# Patient Record
Sex: Male | Born: 1995 | Race: Black or African American | Hispanic: No | Marital: Single | Smoking: Current every day smoker
Health system: Southern US, Community
[De-identification: ages and names within clinical notes are randomized; demographics above are authoritative.]

---
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
## 2018-01-28 ENCOUNTER — Encounter (HOSPITAL_COMMUNITY): Payer: Self-pay | Admitting: Family Medicine

## 2018-01-28 ENCOUNTER — Ambulatory Visit (HOSPITAL_COMMUNITY)
Admission: EM | Admit: 2018-01-28 | Discharge: 2018-01-28 | Disposition: A | Discharge status: Home / Self Care | Payer: BLUE CROSS/BLUE SHIELD | Attending: Family Medicine | Admitting: Family Medicine

## 2018-01-28 DIAGNOSIS — W57XXXA Bitten or stung by nonvenomous insect and other nonvenomous arthropods, initial encounter: Secondary | ICD-10-CM | POA: Diagnosis not present

## 2018-01-28 DIAGNOSIS — S80861A Insect bite (nonvenomous), right lower leg, initial encounter: Secondary | ICD-10-CM | POA: Diagnosis not present

## 2018-01-28 MED ORDER — DOXYCYCLINE HYCLATE 100 MG PO CAPS: ORAL_CAPSULE | ORAL | 0 refills | Status: AC

## 2018-01-28 NOTE — Discharge Instructions (Signed)
Take the antibiotic Use a cortisone cream on the rash/bite area Return as needed

## 2018-01-28 NOTE — ED Provider Notes (Signed)
[facility]    CSN: [id number] Arrival date & time: 01/28/18  1520     History   Chief Complaint Chief Complaint  Patient presents with  . Insect Bite    HPI Craig Mclaughlin is a 22 y.o. male.   HPI  Patient had a tick on his right ankle last night.  He fallen off.  This morning the area swollen and red and a little bit itchy.  He is never had this reaction to take before.  He is uncertain how long the tick is been on his skin, but it looked like it was firmly attached.  He is in good health and on no medications.  History reviewed. No pertinent past medical history.  Patient Active Problem List   Diagnosis Date Noted  . VIRAL CONJUNCTIVITIS 08/27/2008  . UNSPECIFIED MENTAL OR BEHAVIORAL PROBLEM 09/05/2007  . EPISTAXIS 08/05/2006    History reviewed. No pertinent surgical history.     Home Medications    Prior to Admission medications   Medication Sig Start Date End Date Taking? Authorizing Provider  doxycycline (VIBRAMYCIN) 100 MG capsule Take 2 pills as single dose 01/28/18   Eustace MooreNelson, Nichlos Kunzler Sue, MD    Family History History reviewed. No pertinent family history.  Social History Social History   Tobacco Use  . Smoking status: Never Smoker  . Smokeless tobacco: Never Used  Substance Use Topics  . Alcohol use: Not on file  . Drug use: Not on file     Allergies   Patient has no known allergies.   Review of Systems Review of Systems  Constitutional: Negative for chills and fever.  HENT: Negative for ear pain and sore throat.   Eyes: Negative for pain and visual disturbance.  Respiratory: Negative for cough and shortness of breath.   Cardiovascular: Negative for chest pain and palpitations.  Gastrointestinal: Negative for abdominal pain and vomiting.  Genitourinary: Negative for dysuria and hematuria.  Musculoskeletal: Negative for arthralgias and back pain.  Skin: Positive for wound. Negative for color change and rash.  Neurological:  Negative for seizures and syncope.  All other systems reviewed and are negative.    Physical Exam Triage Vital Signs ED Triage Vitals [01/28/18 1540]  Enc Vitals Group     BP 132/66     Pulse Rate 77     Resp 18     Temp 98.4 F (36.9 C)     Temp src      SpO2 98 %     Weight      Height      Head Circumference      Peak Flow      Pain Score 5     Pain Loc      Pain Edu?      Excl. in GC?    No data found.  Updated Vital Signs BP 132/66   Pulse 77   Temp 98.4 F (36.9 C)   Resp 18   SpO2 98%   Visual Acuity Right Eye Distance:   Left Eye Distance:   Bilateral Distance:    Right Eye Near:   Left Eye Near:    Bilateral Near:     Physical Exam  Constitutional: He appears well-developed and well-nourished. No distress.  HENT:  Head: Normocephalic and atraumatic.  Mouth/Throat: Oropharynx is clear and moist.  Eyes: Pupils are equal, round, and reactive to light. Conjunctivae are normal.  Neck: Normal range of motion.  Cardiovascular: Normal rate.  Pulmonary/Chest: Effort normal.  No respiratory distress.  Abdominal: Soft. He exhibits no distension.  Musculoskeletal: Normal range of motion. He exhibits no edema.  Neurological: He is alert.  Skin: Skin is warm and dry. Rash noted.  On the medial portion of the right ankle there is a punctate wound from the tick removal with 4 cm of surrounding soft tissue swelling and erythema     UC Treatments / Results  Labs (all labs ordered are listed, but only abnormal results are displayed) Labs Reviewed - No data to display  EKG None  Radiology No results found.  Procedures Procedures (including critical care time)  Medications Ordered in UC Medications - No data to display  Initial Impression / Assessment and Plan / UC Course  I have reviewed the triage vital signs and the nursing notes.  Pertinent labs & imaging results that were available during my care of the patient were reviewed by me and  considered in my medical decision making (see chart for details).      Final Clinical Impressions(s) / UC Diagnoses   Final diagnoses:  Tick bite, initial encounter     Discharge Instructions     Take the antibiotic Use a cortisone cream on the rash/bite area Return as needed   ED Prescriptions    Medication Sig Dispense Auth. Provider   doxycycline (VIBRAMYCIN) 100 MG capsule Take 2 pills as single dose 2 capsule Eustace Moore, MD     Controlled Substance Prescriptions [place] Controlled Substance Registry consulted? Not Applicable   Eustace Moore, MD 01/28/18 [phone]

## 2018-01-28 NOTE — ED Notes (Addendum)
Pt here for tick bite to RLE. He pulled it off yesterday but this morning noticed the head was still attached. He removed that and now there is redness and swelling to the area. Tender to the touch. He sts some body aches that started this am. He is unsure of when the tick attached but he was working under some trees at work yesterday.

## 2018-06-26 ENCOUNTER — Other Ambulatory Visit: Payer: Self-pay

## 2018-06-26 ENCOUNTER — Emergency Department (HOSPITAL_COMMUNITY)
Admission: EM | Admit: 2018-06-26 | Discharge: 2018-06-26 | Disposition: A | Discharge status: Home / Self Care | Payer: BLUE CROSS/BLUE SHIELD | Attending: Emergency Medicine | Admitting: Emergency Medicine

## 2018-06-26 ENCOUNTER — Encounter (HOSPITAL_COMMUNITY): Payer: Self-pay | Admitting: Emergency Medicine

## 2018-06-26 DIAGNOSIS — H5789 Other specified disorders of eye and adnexa: Secondary | ICD-10-CM | POA: Diagnosis not present

## 2018-06-26 DIAGNOSIS — F17210 Nicotine dependence, cigarettes, uncomplicated: Secondary | ICD-10-CM | POA: Insufficient documentation

## 2018-06-26 DIAGNOSIS — H5711 Ocular pain, right eye: Secondary | ICD-10-CM | POA: Diagnosis present

## 2018-06-26 MED ORDER — TETRACAINE HCL 0.5 % OP SOLN
2.0000 [drp] | Freq: Once | OPHTHALMIC | Status: AC
  Administered 2018-06-26: 2 [drp] via OPHTHALMIC
  Filled 2018-06-26: qty 4

## 2018-06-26 MED ORDER — FLUORESCEIN SODIUM 1 MG OP STRP
1.0000 | ORAL_STRIP | Freq: Once | OPHTHALMIC | Status: AC
  Administered 2018-06-26: 1 via OPHTHALMIC
  Filled 2018-06-26: qty 1

## 2018-06-26 NOTE — ED Notes (Signed)
ED Provider at bedside. 

## 2018-06-26 NOTE — ED Triage Notes (Signed)
C/o pain and redness to R eye since 3pm.  States he was tearing down a roof and thinks he got something in it.

## 2018-06-26 NOTE — Discharge Instructions (Signed)
Use over the counter eyedrops for lubrication as needed. Use a cool compress to help with irritation of the eye. Otherwise, try not to touch or irritate your eye.   If it is not improving the next several days, follow-up with eye doctor whose information is below. Return to the emergency room with vision loss, inability to move your eye, severe pain, or any new or concerning symptoms.

## 2018-06-27 NOTE — ED Provider Notes (Signed)
[facility] EMERGENCY DEPARTMENT Provider Note   CSN: [id number] Arrival date & time: 06/26/18  1940     History   Chief Complaint Chief Complaint  Patient presents with  . Eye Pain    HPI Craig Mclaughlin is a 22 y.o. male presenting for evaluation of right eye irritation.   Patient states he was taking shingles off a roof when he felt like some dust got in his eye.  He washed his eye with water, but has remaining eye irritation.  It is worse with movement of his eye.  He denies vision changes.  He denies eye pain.  He denies drainage from the eye.  He does not wear contacts or glasses.  He denies symptoms in his left eye.  He has never had issues with his eyes before, never seen an optometrist/ophthalmologist.  He has no medical problems, takes no medications daily.  HPI  History reviewed. No pertinent past medical history.  Patient Active Problem List   Diagnosis Date Noted  . VIRAL CONJUNCTIVITIS 08/27/2008  . UNSPECIFIED MENTAL OR BEHAVIORAL PROBLEM 09/05/2007  . EPISTAXIS 08/05/2006    History reviewed. No pertinent surgical history.      Home Medications    Prior to Admission medications   Medication Sig Start Date End Date Taking? Authorizing Provider  doxycycline (VIBRAMYCIN) 100 MG capsule Take 2 pills as single dose 01/28/18   Eustace Moore, MD    Family History No family history on file.  Social History Social History   Tobacco Use  . Smoking status: Current Every Day Smoker  . Smokeless tobacco: Never Used  Substance Use Topics  . Alcohol use: Not Currently  . Drug use: Not Currently     Allergies   Patient has no known allergies.   Review of Systems Review of Systems  Constitutional: Negative for fever.  Eyes: Negative for photophobia and visual disturbance.       Right eye irritation and redness.     Physical Exam Updated Vital Signs BP 115/64 (BP Location: Right Arm)   Pulse (!) 58   Temp 98.4 F (36.9  C) (Oral)   Resp 14   Ht 6\' 1"  (1.854 m)   Wt 108.9 kg   SpO2 100%   BMI 31.66 kg/m   Physical Exam  Constitutional: He is oriented to person, place, and time. He appears well-developed and well-nourished. No distress.  Sitting comfortably in the bed in no acute distress  HENT:  Head: Normocephalic and atraumatic.  OP clear without tonsillar swelling or exudate.  Uvula midline with equal palate rise.  TMs nonerythematous and nonbulging bilaterally.  Eyes: Pupils are equal, round, and reactive to light. EOM and lids are normal. Right conjunctiva is injected. Left conjunctiva is not injected. Left conjunctiva has no hemorrhage.  Slit lamp exam:      The right eye shows no corneal abrasion, no foreign body and no fluorescein uptake.  Conjunctival injection of the right eye on the medial aspect.  No injection of the lateral eye.  EOMI and PERRLA.  No fluorescein stain uptake or obvious foreign bodies.  No abrasions.  pH of right eye 7.  Irritation resolved with tetracaine drops  Neck: Normal range of motion.  Cardiovascular: Normal rate, regular rhythm and intact distal pulses.  Pulmonary/Chest: Effort normal and breath sounds normal. No respiratory distress. He has no wheezes.  Abdominal: He exhibits no distension.  Musculoskeletal: Normal range of motion.  Neurological: He is alert and oriented  to person, place, and time.  Skin: Skin is warm. No rash noted.  Psychiatric: He has a normal mood and affect.  Nursing note and vitals reviewed.    ED Treatments / Results  Labs (all labs ordered are listed, but only abnormal results are displayed) Labs Reviewed - No data to display  EKG None  Radiology No results found.  Procedures Procedures (including critical care time)  Medications Ordered in ED Medications  tetracaine (PONTOCAINE) 0.5 % ophthalmic solution 2 drop (2 drops Right Eye Given 06/26/18 2245)  fluorescein ophthalmic strip 1 strip (1 strip Right Eye Given 06/26/18  2245)     Initial Impression / Assessment and Plan / ED Course  I have reviewed the triage vital signs and the nursing notes.  Pertinent labs & imaging results that were available during my care of the patient were reviewed by me and considered in my medical decision making (see chart for details).     Pt presenting for evaluation of right eye irritation.  Physical exam reassuring, no visual acuity changes, vision loss, or significant pain.  No fluorescein stain uptake or abnormalities of the pH.  Symptoms improved with tetracaine drops.  As there is no foreign body or scrape, likely irritation conjunctivitis.  Doubt acute angle-closure, no pain.  Doubt iritis/scleritis, no pain or photophobia.  Discussed findings with patient.  Discussed treatment with lubricating eyedrops, cool compresses, and time.  Discussed follow-up with ophthalmology if symptoms not improving.  Strict return precautions given.  At this time, patient appears safe for discharge.  Return precautions given.  Patient states he understands and agrees to plan.   Final Clinical Impressions(s) / ED Diagnoses   Final diagnoses:  Eye irritation    ED Discharge Orders    None       Alveria Apley, PA-C 06/27/18 0045    Raeford Razor, MD 06/30/18 1010

## 2024-02-01 ENCOUNTER — Encounter (HOSPITAL_COMMUNITY): Payer: Self-pay | Admitting: *Deleted

## 2024-02-01 ENCOUNTER — Emergency Department (HOSPITAL_COMMUNITY): Payer: Self-pay

## 2024-02-01 ENCOUNTER — Emergency Department (HOSPITAL_COMMUNITY)
Admission: EM | Admit: 2024-02-01 | Discharge: 2024-02-01 | Disposition: A | Discharge status: Home / Self Care | Payer: Self-pay | Attending: Emergency Medicine | Admitting: Emergency Medicine

## 2024-02-01 ENCOUNTER — Other Ambulatory Visit: Payer: Self-pay

## 2024-02-01 DIAGNOSIS — W312XXA Contact with powered woodworking and forming machines, initial encounter: Secondary | ICD-10-CM | POA: Insufficient documentation

## 2024-02-01 DIAGNOSIS — Z23 Encounter for immunization: Secondary | ICD-10-CM | POA: Insufficient documentation

## 2024-02-01 DIAGNOSIS — S51811A Laceration without foreign body of right forearm, initial encounter: Secondary | ICD-10-CM | POA: Insufficient documentation

## 2024-02-01 MED ORDER — LIDOCAINE-EPINEPHRINE (PF) 2 %-1:200000 IJ SOLN
10.0000 mL | Freq: Once | INTRAMUSCULAR | Status: AC
  Administered 2024-02-01: 10 mL via INTRADERMAL
  Filled 2024-02-01: qty 20

## 2024-02-01 MED ORDER — TETANUS-DIPHTH-ACELL PERTUSSIS 5-2.5-18.5 LF-MCG/0.5 IM SUSY
0.5000 mL | PREFILLED_SYRINGE | Freq: Once | INTRAMUSCULAR | Status: AC
  Administered 2024-02-01: 0.5 mL via INTRAMUSCULAR
  Filled 2024-02-01: qty 0.5

## 2024-02-01 MED ORDER — LIDOCAINE-EPINEPHRINE-TETRACAINE (LET) TOPICAL GEL
3.0000 mL | Freq: Once | TOPICAL | Status: AC
  Administered 2024-02-01: 3 mL via TOPICAL
  Filled 2024-02-01: qty 3

## 2024-02-01 NOTE — ED Triage Notes (Signed)
 Pt states he was using a band saw and accidentally  hit his right arm; pt has small laceration to right forearm with no bleeding at this time  Pt states he does not know when he received his last tetanus shot

## 2024-02-01 NOTE — Discharge Instructions (Signed)
 5 stitches will need to be removed in approximately 1 week.  Keep area of wound clean and dry.  If area becomes increasingly red, painful, swollen, or begins draining pus, return to the emergency department.

## 2024-02-01 NOTE — ED Provider Notes (Signed)
 [place] EMERGENCY DEPARTMENT AT [facility] Provider Note   CSN: [id number] Arrival date & time: 02/01/24  0351     History  Chief Complaint  Patient presents with   Laceration    Craig Mclaughlin is a 28 y.o. male.  HPI Patient presents for laceration.  He has no known chronic medical conditions.  He is unaware when his last tetanus shot was.  This morning, patient was cutting with a saw.  The saw blade shattered and a piece of it cut him on the posterior aspect of his right forearm.  He denies any other areas of injury concern.    Home Medications Prior to Admission medications   Medication Sig Start Date End Date Taking? Authorizing Provider  doxycycline  (VIBRAMYCIN ) 100 MG capsule Take 2 pills as single dose 01/28/18   Stephany Ehrich, MD      Allergies    Patient has no known allergies.    Review of Systems   Review of Systems  Skin:  Positive for wound.  All other systems reviewed and are negative.   Physical Exam Updated Vital Signs BP (!) 147/76   Pulse 68   Temp 98.2 F (36.8 C) (Oral)   Resp 17   Ht 6\' 1"  (1.854 m)   Wt 88.5 kg   SpO2 96%   BMI 25.73 kg/m  Physical Exam Vitals and nursing note reviewed.  Constitutional:      General: He is not in acute distress.    Appearance: Normal appearance. He is well-developed. He is not ill-appearing, toxic-appearing or diaphoretic.  HENT:     Head: Normocephalic and atraumatic.     Right Ear: External ear normal.     Left Ear: External ear normal.     Nose: Nose normal.     Mouth/Throat:     Mouth: Mucous membranes are moist.  Eyes:     Extraocular Movements: Extraocular movements intact.     Conjunctiva/sclera: Conjunctivae normal.  Cardiovascular:     Rate and Rhythm: Normal rate and regular rhythm.  Pulmonary:     Effort: Pulmonary effort is normal. No respiratory distress.  Abdominal:     General: There is no distension.     Palpations: Abdomen is soft.  Musculoskeletal:         General: Signs of injury present. No swelling or deformity.     Cervical back: Normal range of motion and neck supple.  Skin:    General: Skin is warm and dry.     Coloration: Skin is not jaundiced or pale.  Neurological:     General: No focal deficit present.     Mental Status: He is alert and oriented to person, place, and time.     Cranial Nerves: No cranial nerve deficit.     Sensory: No sensory deficit.     Motor: No weakness.     Coordination: Coordination normal.  Psychiatric:        Mood and Affect: Mood normal.        Behavior: Behavior normal.     ED Results / Procedures / Treatments   Labs (all labs ordered are listed, but only abnormal results are displayed) Labs Reviewed - No data to display  EKG None  Radiology DG Forearm Right Result Date: 02/01/2024 CLINICAL DATA:  28 year old male with band saw accident, trauma to right upper extremity. Query foreign body. EXAM: RIGHT FOREARM - 2 VIEW COMPARISON:  None Available. FINDINGS: Three views of the right forearm [id number]  hours. Soft tissue irregularity and deficiency overlying the distal 3rd right ulna. No tracking soft tissue gas elsewhere. No radiopaque foreign body identified. Normal underlying bone mineralization. Maintained alignment at the elbow and wrist. No evidence of elbow joint effusion. No osseous abnormality identified. IMPRESSION: Soft tissue injury to the distal right forearm with no radiopaque foreign body or osseous abnormality identified. Electronically Signed   By: Marlise Simpers M.D.   On: 02/01/2024 04:57    Procedures .Laceration Repair  Date/Time: 02/01/2024 5:28 AM  Performed by: Iva Mariner, MD Authorized by: Iva Mariner, MD   Consent:    Consent obtained:  Verbal   Consent given by:  Patient   Risks, benefits, and alternatives were discussed: yes     Risks discussed:  Pain   Alternatives discussed:  No treatment and delayed treatment Universal protocol:    Procedure explained and questions  answered to patient or proxy's satisfaction: yes     Imaging studies available: yes     Patient identity confirmed:  Verbally with patient Anesthesia:    Anesthesia method:  Topical application and local infiltration   Topical anesthetic:  LET   Local anesthetic:  Lidocaine 2% WITH epi Laceration details:    Location:  Shoulder/arm   Shoulder/arm location:  R lower arm   Length (cm):  3   Depth (mm):  5 Pre-procedure details:    Preparation:  Imaging obtained to evaluate for foreign bodies and patient was prepped and draped in usual sterile fashion Exploration:    Imaging obtained: x-ray     Imaging outcome: foreign body not noted     Wound exploration: entire depth of wound visualized   Treatment:    Area cleansed with:  Saline   Amount of cleaning:  Standard   Irrigation solution:  Sterile saline   Irrigation volume:  200cc   Irrigation method:  Syringe   Visualized foreign bodies/material removed: no   Skin repair:    Repair method:  Sutures   Suture size:  5-0   Suture material:  Nylon   Suture technique:  Simple interrupted   Number of sutures:  5 Approximation:    Approximation:  Close Repair type:    Repair type:  Simple Post-procedure details:    Dressing:  Adhesive bandage   Procedure completion:  Tolerated well, no immediate complications     Medications Ordered in ED Medications  lidocaine-EPINEPHrine (XYLOCAINE W/EPI) 2 %-1:200000 (PF) injection 10 mL (has no administration in time range)  lidocaine-EPINEPHrine-tetracaine  (LET) topical gel (3 mLs Topical Given 02/01/24 0426)  Tdap (BOOSTRIX) injection 0.5 mL (0.5 mLs Intramuscular Given 02/01/24 0426)    ED Course/ Medical Decision Making/ A&P                                 Medical Decision Making Amount and/or Complexity of Data Reviewed Radiology: ordered.  Risk Prescription drug management.   Patient presenting for right forearm laceration.  On arrival in the ED, patient is well-appearing.   Wound is hemostatic at this time.  He states that it was caused by a shattered sawblade.  Will obtain x-ray imaging to assess for radiopaque foreign bodies.  He is unaware of when his last tetanus shot was.  Will update today.  Let gel was applied to wound.  X-ray imaging do not show any evidence of foreign bodies.  Laceration was repaired, as per procedure note above.  Patient was discharged in  stable condition.        Final Clinical Impression(s) / ED Diagnoses Final diagnoses:  Forearm laceration, right, initial encounter    Rx / DC Orders ED Discharge Orders     None         Iva Mariner, MD 02/01/24 0530
